# Patient Record
Sex: Female | Born: 1985 | Race: Black or African American | Hispanic: No | Marital: Single | Smoking: Never smoker
Health system: Southern US, Community
[De-identification: ages and names within clinical notes are randomized; demographics above are authoritative.]

## PROBLEM LIST (undated history)

## (undated) DIAGNOSIS — F329 Major depressive disorder, single episode, unspecified: Secondary | ICD-10-CM

## (undated) DIAGNOSIS — Z789 Other specified health status: Secondary | ICD-10-CM

## (undated) DIAGNOSIS — F32A Depression, unspecified: Secondary | ICD-10-CM

## (undated) HISTORY — PX: NO PAST SURGERIES: SHX2092

---

## 2009-01-25 ENCOUNTER — Encounter: Admission: RE | Admit: 2009-01-25 | Discharge: 2009-01-25 | Payer: Self-pay | Admitting: Pulmonary Disease

## 2009-05-01 ENCOUNTER — Emergency Department (HOSPITAL_COMMUNITY): Admission: EM | Admit: 2009-05-01 | Discharge: 2009-05-02 | Payer: Self-pay | Admitting: Emergency Medicine

## 2009-12-04 ENCOUNTER — Inpatient Hospital Stay (HOSPITAL_COMMUNITY): Admission: AD | Admit: 2009-12-04 | Discharge: 2009-12-05 | Payer: Self-pay | Admitting: Obstetrics

## 2009-12-06 ENCOUNTER — Inpatient Hospital Stay (HOSPITAL_COMMUNITY): Admission: AD | Admit: 2009-12-06 | Discharge: 2009-12-08 | Payer: Self-pay | Admitting: Obstetrics

## 2010-12-18 NOTE — L&D Delivery Note (Signed)
Delivery Note At 6:39 AM a viable female was delivered via Vaginal, Spontaneous Delivery (Presentation: ;  ).  APGAR: , ; weight .   Placenta status: , .  Cord:  with the following complications: .  Cord pH: not done  Anesthesia: Epidural  Episiotomy:  Lacerations:  Suture Repair: 2.0 Est. Blood Loss (mL):   Mom to postpartum.  Baby to nursery-stable.  MARSHALL,BERNARD A 10/08/2011, 6:51 AM

## 2010-12-24 IMAGING — CT CT HEAD W/O CM
1 of 2 series · 16 of 30 positions shown, 20 images · non-contrast
Comparison: None

CLINICAL DATA: Altered level of consciousness.  The patient is
unresponsive.

CT HEAD WITHOUT CONTRAST
TECHNIQUE: Contiguous axial images were obtained from the base of
the skull through the vertex without contrast.

[Series 3: recon 2: brain · axial · 0.49mm/px · z∈[-148,-23]mm · 16 of 56 slices shown, 20 images]
[im 3/56  brain]
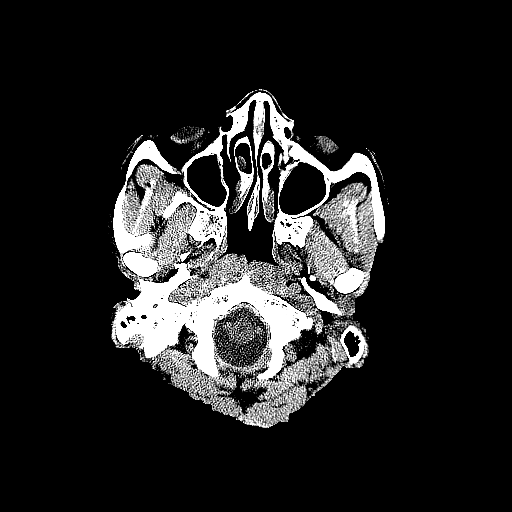
[im 3/56  bone]
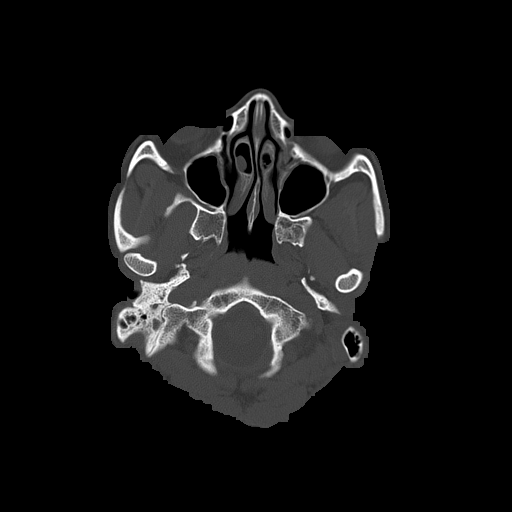
[im 6/56  brain]
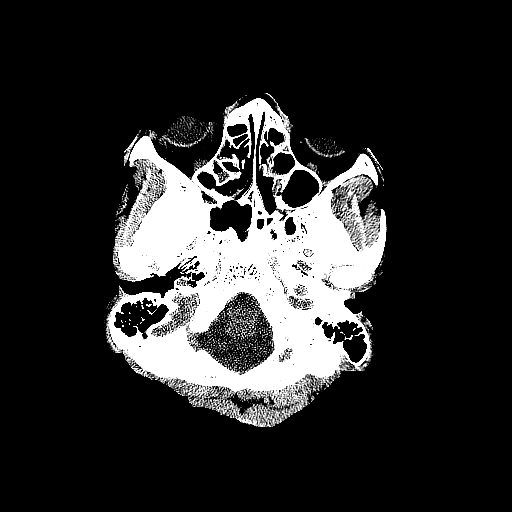
[im 9/56  brain]
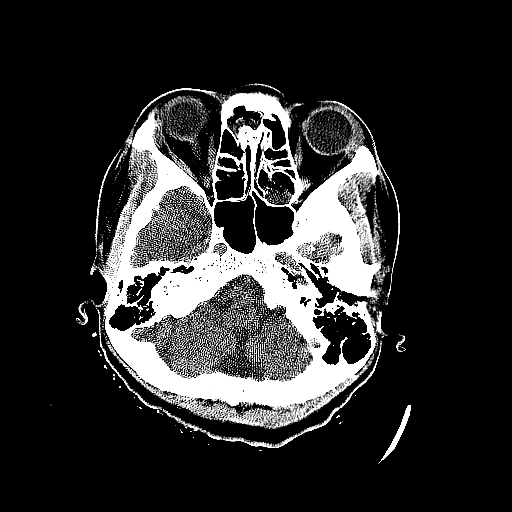
[im 12/56  brain]
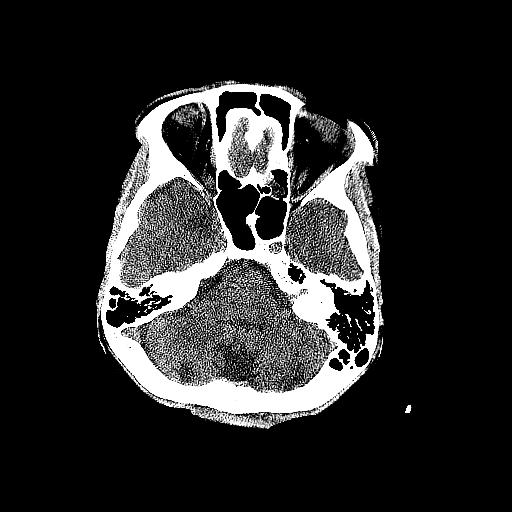
[im 18/56  brain]
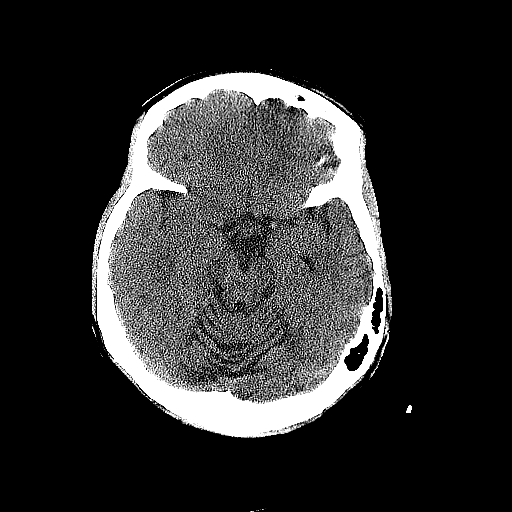
[im 18/56  bone]
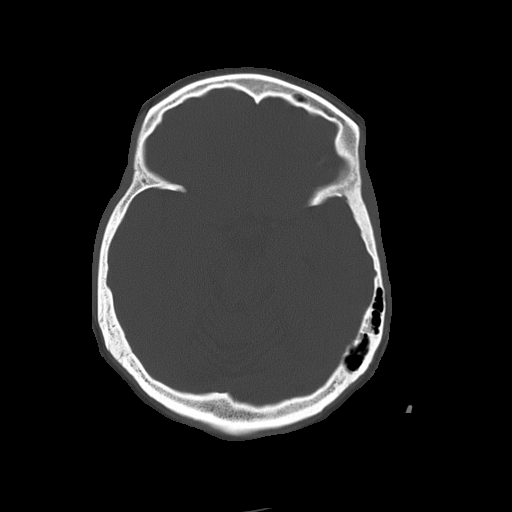
[im 21/56  brain]
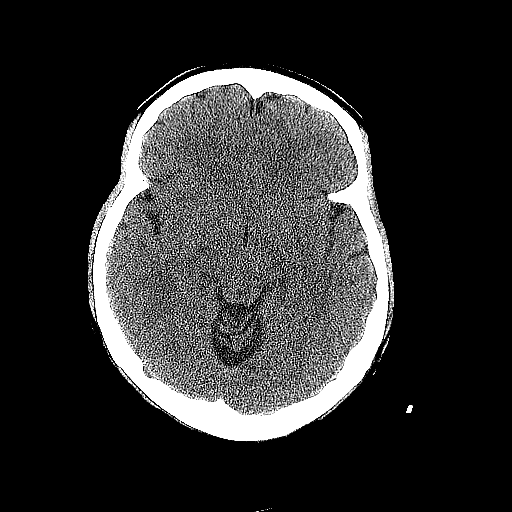
[im 24/56  brain]
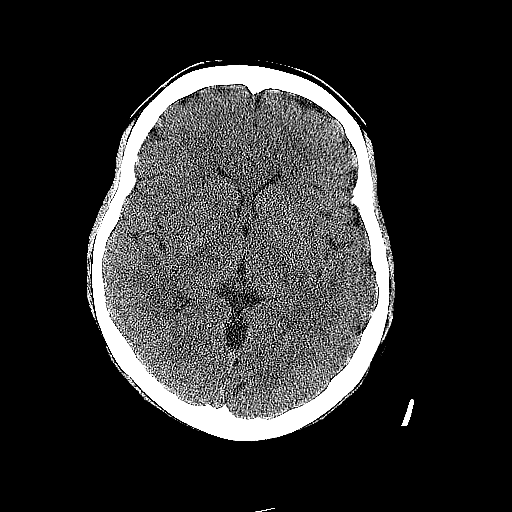
[im 27/56  brain]
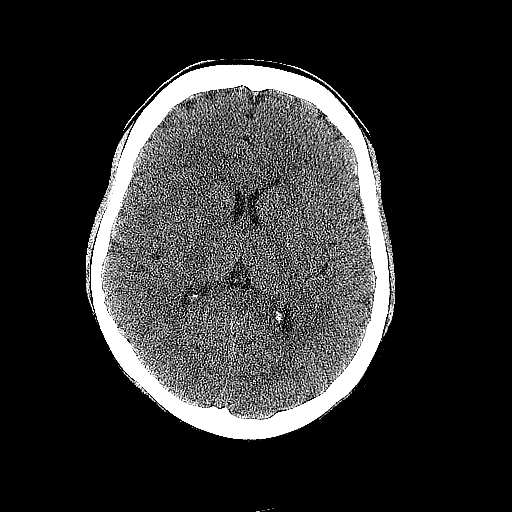
[im 29/56  brain]
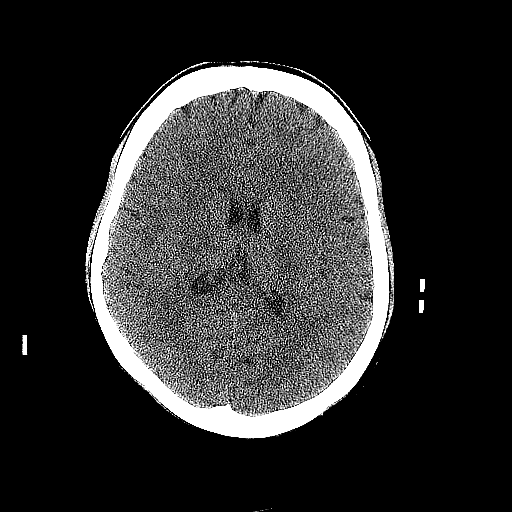
[im 29/56  bone]
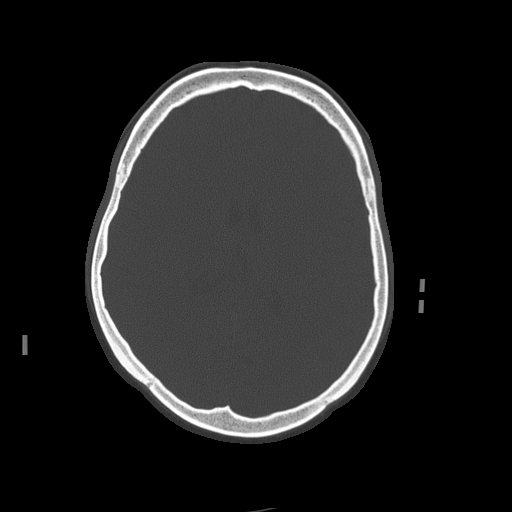
[im 32/56  brain]
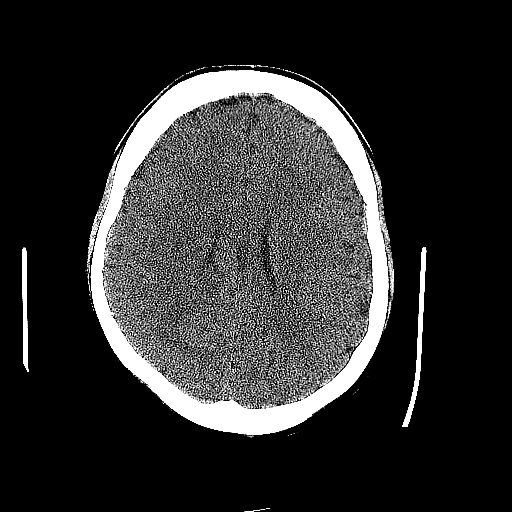
[im 35/56  brain]
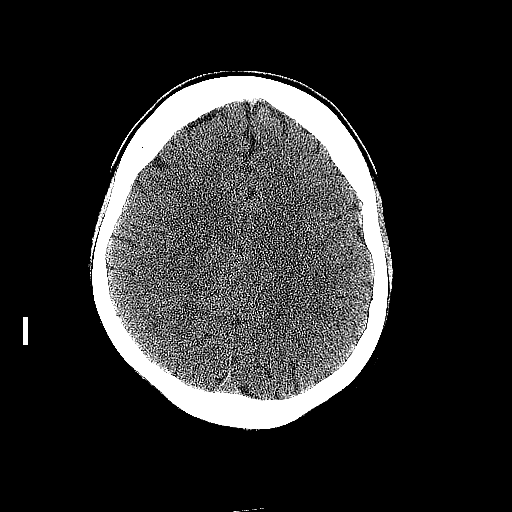
[im 38/56  brain]
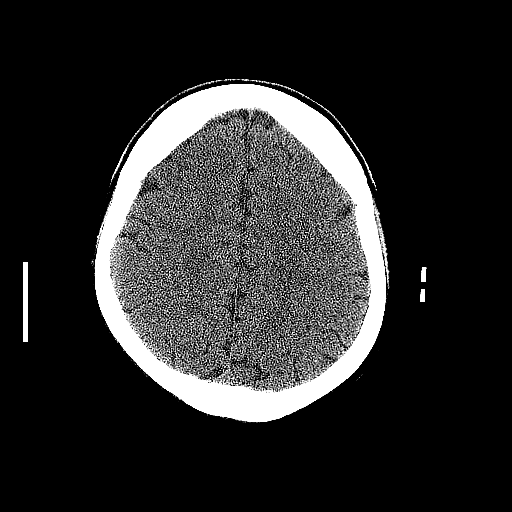
[im 44/56  brain]
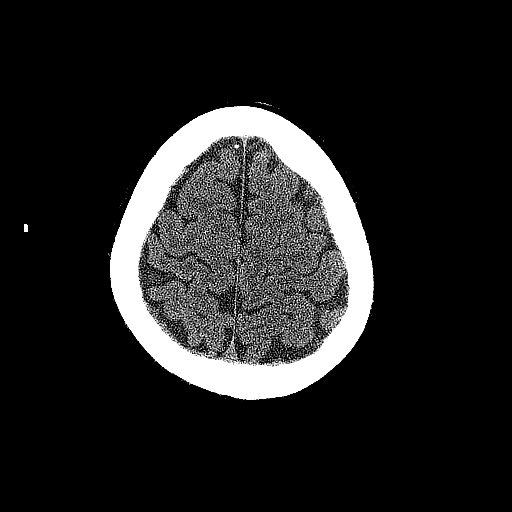
[im 44/56  bone]
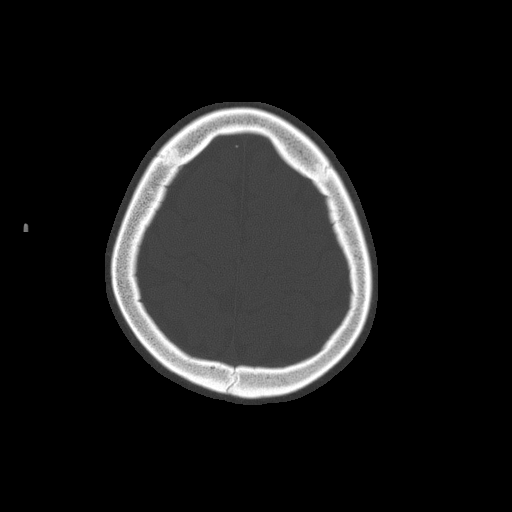
[im 47/56  brain]
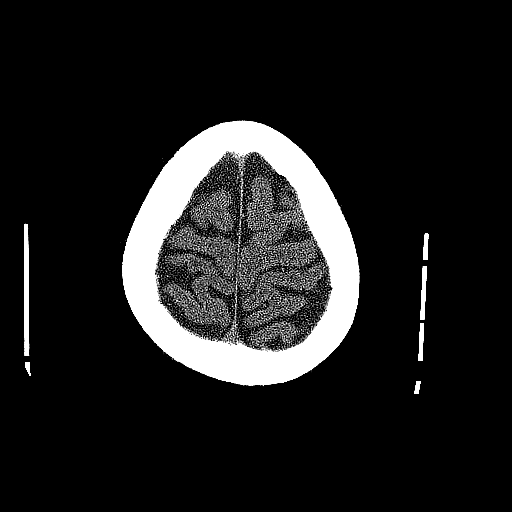
[im 50/56  brain]
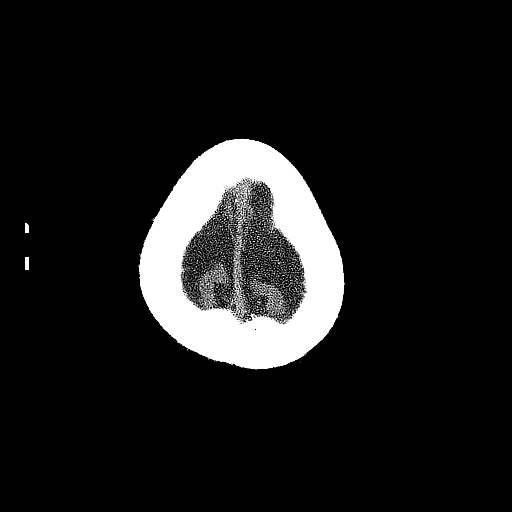
[im 53/56  brain]
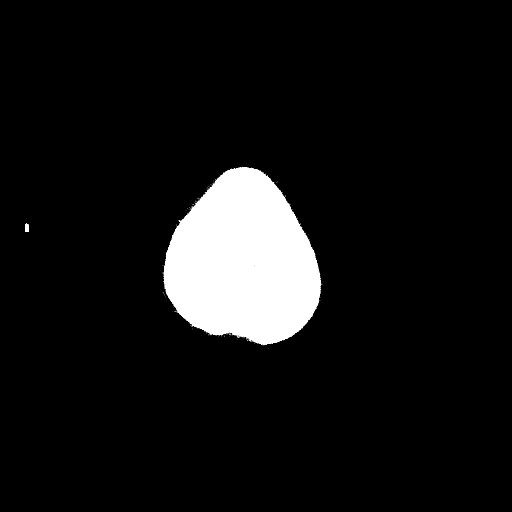

[16 of 30 positions shown; findings below may reference images not displayed]

FINDINGS: There is no acute intracranial hemorrhage, acute
infarction, or intracranial mass lesion.  There is cerebral
hemispheres appear normal.  The patient appears to have mild
atrophy of the cerebellum for a patient of this age.

The bony structures are normal except for some minimal mucosal
thickening in one of the left posterior ethmoid air cells.
IMPRESSION: No acute abnormality of the brain.  Cerebellar atrophy.

## 2010-12-24 IMAGING — CR DG CHEST 1V PORT
1 series · 1 of 1 positions shown · non-contrast
Comparison: 01/25/2009

CLINICAL DATA: Unresponsive.  Altered level of consciousness.

PORTABLE CHEST - 1 VIEW

[AP]
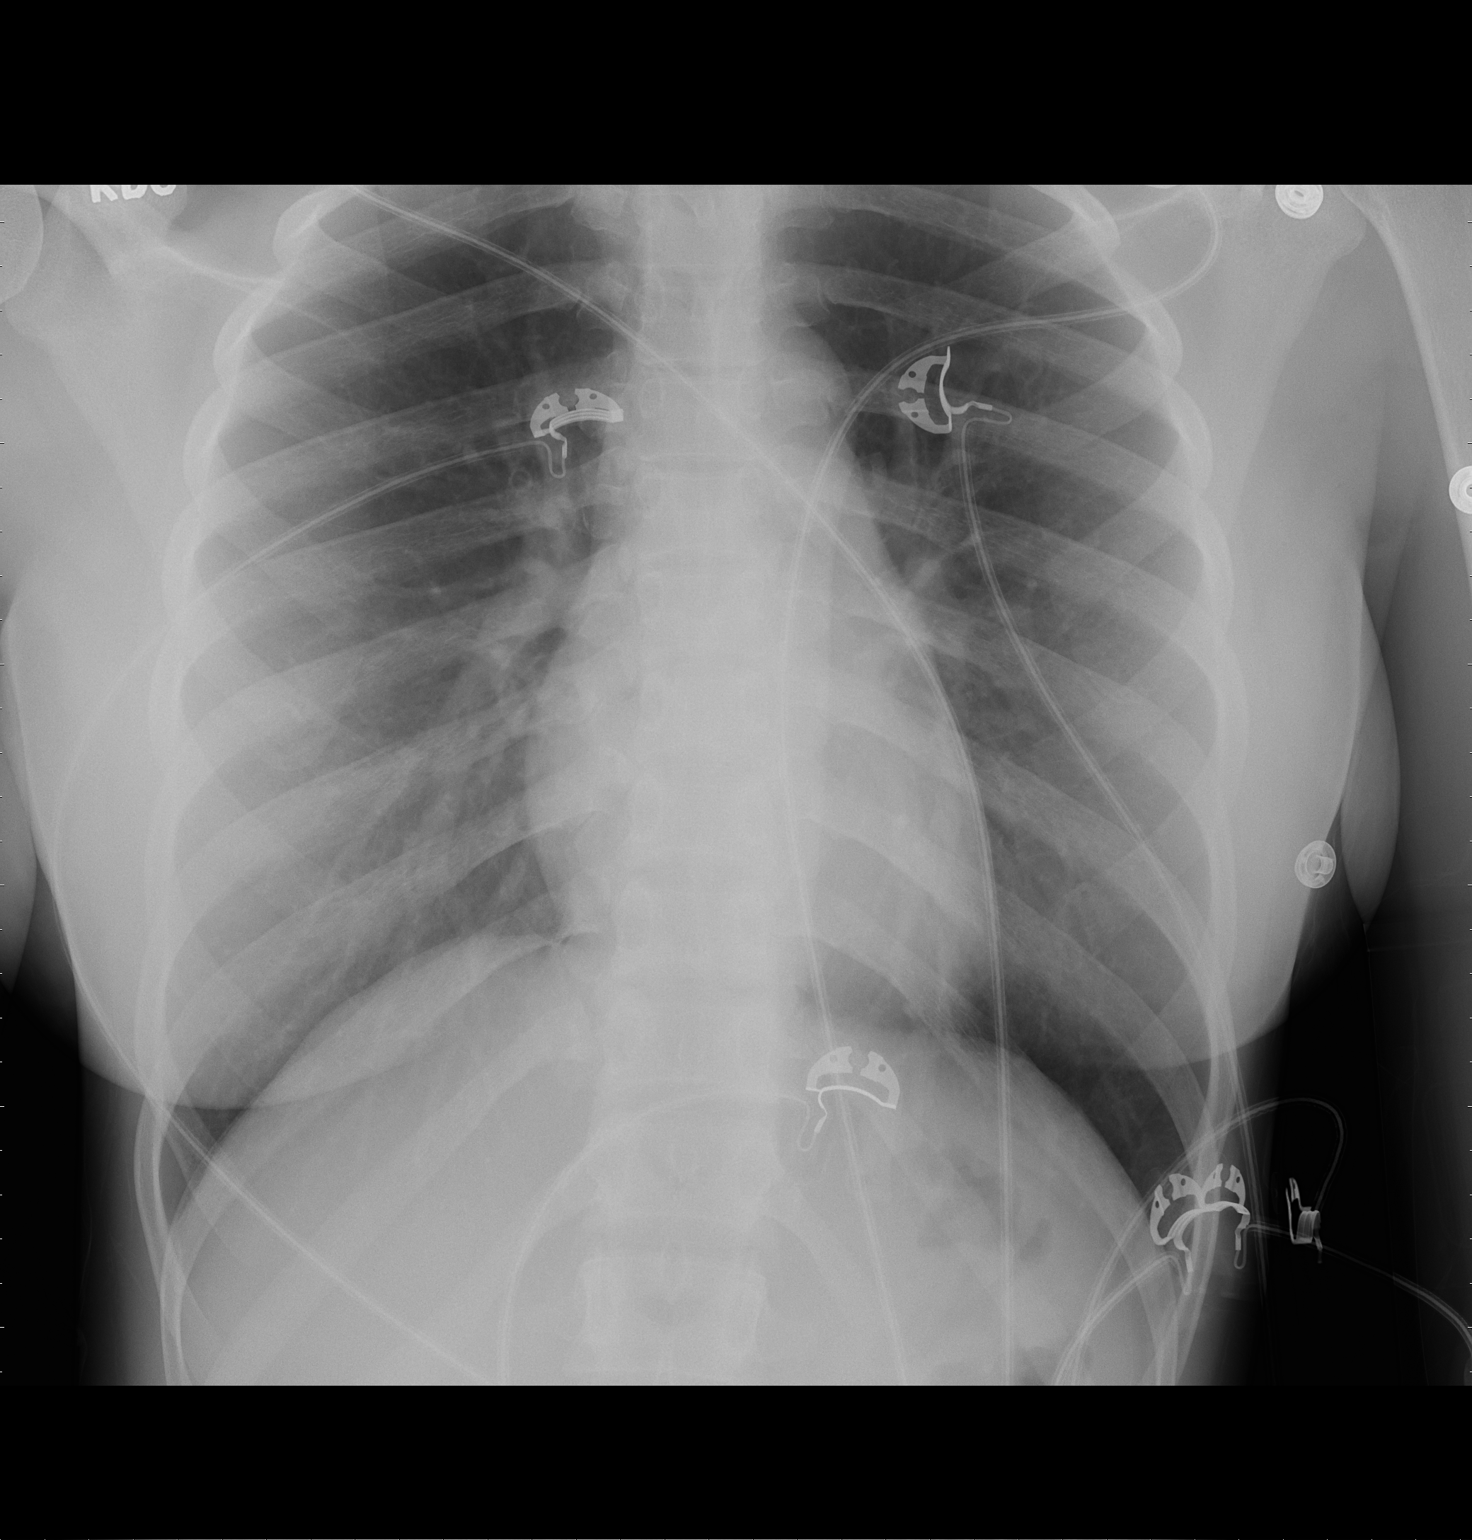

[1 of 1 positions shown; findings below may reference images not displayed]

FINDINGS: Heart size is normal.

There is no pleural effusion or pulmonary edema.

No airspace densities identified.

Review of the visualized osseous structures is unremarkable.
IMPRESSION: 1.  No acute findings.

## 2011-01-25 ENCOUNTER — Ambulatory Visit: Payer: Self-pay

## 2011-01-25 ENCOUNTER — Other Ambulatory Visit: Payer: Self-pay | Admitting: Occupational Medicine

## 2011-01-25 DIAGNOSIS — R7611 Nonspecific reaction to tuberculin skin test without active tuberculosis: Secondary | ICD-10-CM

## 2011-03-01 ENCOUNTER — Encounter (HOSPITAL_COMMUNITY): Payer: Self-pay | Admitting: Radiology

## 2011-03-01 ENCOUNTER — Inpatient Hospital Stay (HOSPITAL_COMMUNITY): Payer: Medicaid Other

## 2011-03-01 ENCOUNTER — Inpatient Hospital Stay (HOSPITAL_COMMUNITY)
Admission: AD | Admit: 2011-03-01 | Discharge: 2011-03-01 | Disposition: A | Discharge status: Home / Self Care | Payer: Medicaid Other | Source: Ambulatory Visit | Attending: Obstetrics | Admitting: Obstetrics

## 2011-03-01 DIAGNOSIS — N390 Urinary tract infection, site not specified: Secondary | ICD-10-CM | POA: Insufficient documentation

## 2011-03-01 DIAGNOSIS — O210 Mild hyperemesis gravidarum: Secondary | ICD-10-CM

## 2011-03-01 DIAGNOSIS — O2390 Unspecified genitourinary tract infection in pregnancy, unspecified trimester: Secondary | ICD-10-CM | POA: Insufficient documentation

## 2011-03-01 LAB — URINALYSIS, ROUTINE W REFLEX MICROSCOPIC
Bilirubin Urine: NEGATIVE
Glucose, UA: NEGATIVE mg/dL
Ketones, ur: NEGATIVE mg/dL
Protein, ur: NEGATIVE mg/dL

## 2011-03-01 LAB — URINE MICROSCOPIC-ADD ON

## 2011-03-02 LAB — URINE CULTURE
Colony Count: 25000
Culture  Setup Time: 201203150114

## 2011-03-20 LAB — CBC
HCT: 38.1 % (ref 36.0–46.0)
MCHC: 33.1 g/dL (ref 30.0–36.0)
MCHC: 33.9 g/dL (ref 30.0–36.0)
MCV: 93.1 fL (ref 78.0–100.0)
Platelets: 162 10*3/uL (ref 150–400)
Platelets: 192 10*3/uL (ref 150–400)
RDW: 13.2 % (ref 11.5–15.5)
RDW: 13.4 % (ref 11.5–15.5)

## 2011-03-20 LAB — RPR: RPR Ser Ql: NONREACTIVE

## 2011-03-28 LAB — GLUCOSE, CAPILLARY

## 2011-03-28 LAB — DIFFERENTIAL
Eosinophils Absolute: 0.7 10*3/uL (ref 0.0–0.7)
Eosinophils Relative: 7 % — ABNORMAL HIGH (ref 0–5)
Lymphocytes Relative: 24 % (ref 12–46)
Lymphs Abs: 2.1 10*3/uL (ref 0.7–4.0)
Monocytes Absolute: 0.8 10*3/uL (ref 0.1–1.0)

## 2011-03-28 LAB — URINALYSIS, ROUTINE W REFLEX MICROSCOPIC
Bilirubin Urine: NEGATIVE
Glucose, UA: NEGATIVE mg/dL
Ketones, ur: 40 mg/dL — AB
Specific Gravity, Urine: 1.03 (ref 1.005–1.030)
pH: 5 (ref 5.0–8.0)

## 2011-03-28 LAB — COMPREHENSIVE METABOLIC PANEL
ALT: 9 U/L (ref 0–35)
AST: 14 U/L (ref 0–37)
Albumin: 3.8 g/dL (ref 3.5–5.2)
CO2: 21 mEq/L (ref 19–32)
Calcium: 9.3 mg/dL (ref 8.4–10.5)
Chloride: 106 mEq/L (ref 96–112)
Creatinine, Ser: 0.62 mg/dL (ref 0.4–1.2)
GFR calc Af Amer: >60 mL/min (ref >60)
GFR calc non Af Amer: >60 mL/min (ref >60)
Sodium: 135 mEq/L (ref 135–145)

## 2011-03-28 LAB — CBC
MCV: 88.7 fL (ref 78.0–100.0)
Platelets: 188 10*3/uL (ref 150–400)
RBC: 4.28 MIL/uL (ref 3.87–5.11)
WBC: 8.9 10*3/uL (ref 4.0–10.5)

## 2011-03-28 LAB — RAPID URINE DRUG SCREEN, HOSP PERFORMED
Amphetamines: NOT DETECTED
Barbiturates: NOT DETECTED
Opiates: NOT DETECTED

## 2011-03-28 LAB — URINE MICROSCOPIC-ADD ON

## 2011-03-28 LAB — HCG, QUANTITATIVE, PREGNANCY: hCG, Beta Chain, Quant, S: 85416 m[IU]/mL — ABNORMAL HIGH (ref <5)

## 2011-03-31 LAB — HIV ANTIBODY (ROUTINE TESTING W REFLEX): HIV: NONREACTIVE

## 2011-03-31 LAB — ABO/RH: RH Type: POSITIVE

## 2011-05-08 ENCOUNTER — Inpatient Hospital Stay (INDEPENDENT_AMBULATORY_CARE_PROVIDER_SITE_OTHER)
Admission: RE | Admit: 2011-05-08 | Discharge: 2011-05-08 | Disposition: A | Discharge status: Home / Self Care | Payer: Medicaid Other | Source: Ambulatory Visit | Attending: Emergency Medicine | Admitting: Emergency Medicine

## 2011-05-08 DIAGNOSIS — B9789 Other viral agents as the cause of diseases classified elsewhere: Secondary | ICD-10-CM

## 2011-05-08 LAB — CBC
HCT: 33.7 % — ABNORMAL LOW (ref 36.0–46.0)
Platelets: 213 10*3/uL (ref 150–400)
RBC: 3.9 MIL/uL (ref 3.87–5.11)
RDW: 12.9 % (ref 11.5–15.5)
WBC: 9.9 10*3/uL (ref 4.0–10.5)

## 2011-05-08 LAB — COMPREHENSIVE METABOLIC PANEL
AST: 12 U/L (ref 0–37)
Albumin: 3.2 g/dL — ABNORMAL LOW (ref 3.5–5.2)
Calcium: 8.9 mg/dL (ref 8.4–10.5)
Creatinine, Ser: 0.49 mg/dL (ref 0.4–1.2)
GFR calc Af Amer: >60 mL/min (ref >60)

## 2011-05-08 LAB — POCT URINALYSIS DIP (DEVICE)
Bilirubin Urine: NEGATIVE
Glucose, UA: NEGATIVE mg/dL
Nitrite: NEGATIVE
pH: 5.5 (ref 5.0–8.0)

## 2011-05-08 LAB — DIFFERENTIAL
Basophils Absolute: 0 10*3/uL (ref 0.0–0.1)
Eosinophils Relative: 5 % (ref 0–5)
Lymphocytes Relative: 26 % (ref 12–46)
Neutrophils Relative %: 62 % (ref 43–77)

## 2011-08-16 ENCOUNTER — Encounter (HOSPITAL_COMMUNITY): Payer: Self-pay | Admitting: *Deleted

## 2011-08-16 ENCOUNTER — Inpatient Hospital Stay (HOSPITAL_COMMUNITY)
Admission: AD | Admit: 2011-08-16 | Discharge: 2011-08-16 | Disposition: A | Discharge status: Home / Self Care | Payer: Medicaid Other | Source: Ambulatory Visit | Attending: Obstetrics | Admitting: Obstetrics

## 2011-08-16 DIAGNOSIS — O4700 False labor before 37 completed weeks of gestation, unspecified trimester: Secondary | ICD-10-CM | POA: Insufficient documentation

## 2011-08-16 LAB — URINALYSIS, ROUTINE W REFLEX MICROSCOPIC
Bilirubin Urine: NEGATIVE
Hgb urine dipstick: NEGATIVE
Nitrite: NEGATIVE
Specific Gravity, Urine: 1.01 (ref 1.005–1.030)
pH: 6 (ref 5.0–8.0)

## 2011-08-16 LAB — URINE MICROSCOPIC-ADD ON

## 2011-08-16 LAB — FETAL FIBRONECTIN: Fetal Fibronectin: NEGATIVE

## 2011-08-16 MED ORDER — DEXTROSE 5 % IN LACTATED RINGERS IV BOLUS: 1000.0000 mL | Freq: Once | INTRAVENOUS | Status: DC

## 2011-08-16 MED ORDER — LACTATED RINGERS IV SOLN
INTRAVENOUS | Status: DC
  Administered 2011-08-16: 17:00:00 via INTRAVENOUS

## 2011-08-16 MED ORDER — TERBUTALINE SULFATE 1 MG/ML IJ SOLN
0.2500 mg | Freq: Once | INTRAMUSCULAR | Status: AC
  Administered 2011-08-16: 0.25 mg via SUBCUTANEOUS
  Filled 2011-08-16: qty 1

## 2011-08-16 NOTE — ED Provider Notes (Signed)
History   pt is [redacted]weeks pregnant, pt of Dr. Elsie Stain who presents with abdominal pain.  Pt said she was having pain on and off before today and saw Dr. Gaynell Face in the office yesterday and he examined her cervix and said it was closed.  Pt began having increase in pain today at 10 am.  Pt says her pain is in her abdomen like contractions.  She denies leakage of fluid or bleeding.   Baby is active  Chief Complaint  Patient presents with  . Contractions   The history is provided by the patient.     No past medical history on file.  No past surgical history on file.  No family history on file.  History  Substance Use Topics  . Smoking status: Never Smoker   . Smokeless tobacco: Not on file  . Alcohol Use: No    Allergies: No Known Allergies  Prescriptions prior to admission  Medication Sig Dispense Refill  . prenatal vitamin w/FE, FA (PRENATAL 1 + 1) 27-1 MG TABS Take 1 tablet by mouth daily.          ROS Physical Exam   Blood pressure 103/60, pulse 81, temperature 98.8 F (37.1 C), temperature source Oral, resp. rate 16, last menstrual period 12/13/2010.  Physical Exam  Vitals reviewed. Constitutional: She is oriented to person, place, and time. She appears well-developed and well-nourished.  HENT:  Head: Normocephalic.  Eyes: Pupils are equal, round, and reactive to light.  Neck: Normal range of motion.  Cardiovascular: Normal rate.   Respiratory: Effort normal.  GI: Soft.  Neurological: She is alert and oriented to person, place, and time.  Skin: Skin is warm and dry.    MAU Course  Procedures Fetal monitor was placed on pt - baseline FHR 135 bpm with reactive strip; contractions every 2 to 3 minutes lasting 50 seconds initially. Cervix closed.  Discussed with Dr. Gaynell Face- pt was hydrated with IVF and given one dose of SQ Terb with resolution of pain and contractions.     Assessment and Plan: Threatened preterm labor- pt to f/u with Dr. Gaynell Face- stay  hydrated-  Kick counts given Report increase in symptoms  LINEBERRY,SUSAN 08/16/2011, 4:39 PM

## 2011-09-25 ENCOUNTER — Encounter (HOSPITAL_COMMUNITY): Payer: Self-pay | Admitting: *Deleted

## 2011-09-25 ENCOUNTER — Inpatient Hospital Stay (HOSPITAL_COMMUNITY)
Admission: AD | Admit: 2011-09-25 | Discharge: 2011-09-25 | Disposition: A | Discharge status: Home / Self Care | Payer: Medicaid Other | Source: Ambulatory Visit | Attending: Obstetrics | Admitting: Obstetrics

## 2011-09-25 DIAGNOSIS — O479 False labor, unspecified: Secondary | ICD-10-CM | POA: Insufficient documentation

## 2011-09-25 HISTORY — DX: Other specified health status: Z78.9

## 2011-09-25 NOTE — Progress Notes (Signed)
Pt G2 P1 at 36.5wks, reports contractions every and passed mucous plug today.  Pt denies problems with pregnancy or vag bleeding.

## 2011-10-08 ENCOUNTER — Encounter (HOSPITAL_COMMUNITY): Payer: Self-pay | Admitting: *Deleted

## 2011-10-08 ENCOUNTER — Inpatient Hospital Stay (HOSPITAL_COMMUNITY)
Admission: AD | Admit: 2011-10-08 | Discharge: 2011-10-10 | DRG: 775 | Disposition: A | Discharge status: Home / Self Care | Payer: Medicaid Other | Source: Ambulatory Visit | Attending: Obstetrics | Admitting: Obstetrics

## 2011-10-08 ENCOUNTER — Encounter (HOSPITAL_COMMUNITY): Payer: Self-pay | Admitting: Anesthesiology

## 2011-10-08 ENCOUNTER — Inpatient Hospital Stay (HOSPITAL_COMMUNITY): Payer: Medicaid Other | Admitting: Anesthesiology

## 2011-10-08 HISTORY — DX: Major depressive disorder, single episode, unspecified: F32.9

## 2011-10-08 HISTORY — DX: Depression, unspecified: F32.A

## 2011-10-08 LAB — CBC
MCH: 27.4 pg (ref 26.0–34.0)
MCHC: 32.6 g/dL (ref 30.0–36.0)
Platelets: 190 10*3/uL (ref 150–400)
RBC: 4.01 MIL/uL (ref 3.87–5.11)

## 2011-10-08 LAB — RPR: RPR Ser Ql: NONREACTIVE

## 2011-10-08 MED ORDER — LIDOCAINE HCL (PF) 1 % IJ SOLN
30.0000 mL | INTRAMUSCULAR | Status: DC | PRN
  Filled 2011-10-08 (×2): qty 30

## 2011-10-08 MED ORDER — ONDANSETRON HCL 4 MG PO TABS: 4.0000 mg | ORAL_TABLET | ORAL | Status: DC | PRN

## 2011-10-08 MED ORDER — OXYCODONE-ACETAMINOPHEN 5-325 MG PO TABS
1.0000 | ORAL_TABLET | ORAL | Status: DC | PRN
  Administered 2011-10-08 – 2011-10-10 (×7): 1 via ORAL
  Filled 2011-10-08 (×7): qty 1

## 2011-10-08 MED ORDER — LACTATED RINGERS IV SOLN
500.0000 mL | Freq: Once | INTRAVENOUS | Status: AC
  Administered 2011-10-08: 500 mL via INTRAVENOUS

## 2011-10-08 MED ORDER — BENZOCAINE-MENTHOL 20-0.5 % EX AERO
1.0000 "application " | INHALATION_SPRAY | CUTANEOUS | Status: DC | PRN
  Administered 2011-10-08: 1 via TOPICAL

## 2011-10-08 MED ORDER — BENZOCAINE-MENTHOL 20-0.5 % EX AERO
INHALATION_SPRAY | CUTANEOUS | Status: AC
  Administered 2011-10-08: 21:00:00
  Filled 2011-10-08: qty 56

## 2011-10-08 MED ORDER — SENNOSIDES-DOCUSATE SODIUM 8.6-50 MG PO TABS: 2.0000 | ORAL_TABLET | Freq: Every day | ORAL | Status: DC

## 2011-10-08 MED ORDER — LACTATED RINGERS IV SOLN
INTRAVENOUS | Status: DC
  Administered 2011-10-08: 05:00:00 via INTRAVENOUS

## 2011-10-08 MED ORDER — FLEET ENEMA 7-19 GM/118ML RE ENEM: 1.0000 | ENEMA | RECTAL | Status: DC | PRN

## 2011-10-08 MED ORDER — DIBUCAINE 1 % RE OINT: 1.0000 "application " | TOPICAL_OINTMENT | RECTAL | Status: DC | PRN

## 2011-10-08 MED ORDER — SENNOSIDES-DOCUSATE SODIUM 8.6-50 MG PO TABS
2.0000 | ORAL_TABLET | Freq: Every day | ORAL | Status: DC
  Administered 2011-10-08 – 2011-10-09 (×2): 2 via ORAL

## 2011-10-08 MED ORDER — ONDANSETRON HCL 4 MG/2ML IJ SOLN: 4.0000 mg | INTRAMUSCULAR | Status: DC | PRN

## 2011-10-08 MED ORDER — OXYTOCIN 20 UNITS IN LACTATED RINGERS INFUSION - SIMPLE
125.0000 mL/h | Freq: Once | INTRAVENOUS | Status: DC
  Administered 2011-10-08: 999 mL/h via INTRAVENOUS

## 2011-10-08 MED ORDER — WITCH HAZEL-GLYCERIN EX PADS: 1.0000 "application " | MEDICATED_PAD | CUTANEOUS | Status: DC | PRN

## 2011-10-08 MED ORDER — IBUPROFEN 600 MG PO TABS: 600.0000 mg | ORAL_TABLET | Freq: Four times a day (QID) | ORAL | Status: DC | PRN

## 2011-10-08 MED ORDER — LANOLIN HYDROUS EX OINT: TOPICAL_OINTMENT | CUTANEOUS | Status: DC | PRN

## 2011-10-08 MED ORDER — SIMETHICONE 80 MG PO CHEW: 80.0000 mg | CHEWABLE_TABLET | ORAL | Status: DC | PRN

## 2011-10-08 MED ORDER — EPHEDRINE 5 MG/ML INJ
10.0000 mg | INTRAVENOUS | Status: DC | PRN
  Filled 2011-10-08: qty 4

## 2011-10-08 MED ORDER — PHENYLEPHRINE 40 MCG/ML (10ML) SYRINGE FOR IV PUSH (FOR BLOOD PRESSURE SUPPORT)
80.0000 ug | PREFILLED_SYRINGE | INTRAVENOUS | Status: DC | PRN
  Filled 2011-10-08 (×2): qty 5

## 2011-10-08 MED ORDER — TETANUS-DIPHTH-ACELL PERTUSSIS 5-2.5-18.5 LF-MCG/0.5 IM SUSP
0.5000 mL | Freq: Once | INTRAMUSCULAR | Status: AC
  Administered 2011-10-09: 0.5 mL via INTRAMUSCULAR
  Filled 2011-10-08: qty 0.5

## 2011-10-08 MED ORDER — OXYCODONE-ACETAMINOPHEN 5-325 MG PO TABS: 1.0000 | ORAL_TABLET | ORAL | Status: DC | PRN

## 2011-10-08 MED ORDER — FENTANYL 2.5 MCG/ML BUPIVACAINE 1/10 % EPIDURAL INFUSION (WH - ANES)
INTRAMUSCULAR | Status: DC | PRN
  Administered 2011-10-08: 14 mL/h via EPIDURAL

## 2011-10-08 MED ORDER — EPHEDRINE 5 MG/ML INJ
10.0000 mg | INTRAVENOUS | Status: DC | PRN
  Filled 2011-10-08 (×2): qty 4

## 2011-10-08 MED ORDER — DIPHENHYDRAMINE HCL 25 MG PO CAPS: 25.0000 mg | ORAL_CAPSULE | Freq: Four times a day (QID) | ORAL | Status: DC | PRN

## 2011-10-08 MED ORDER — TETANUS-DIPHTH-ACELL PERTUSSIS 5-2.5-18.5 LF-MCG/0.5 IM SUSP: 0.5000 mL | Freq: Once | INTRAMUSCULAR | Status: DC

## 2011-10-08 MED ORDER — FENTANYL 2.5 MCG/ML BUPIVACAINE 1/10 % EPIDURAL INFUSION (WH - ANES)
14.0000 mL/h | INTRAMUSCULAR | Status: DC
  Filled 2011-10-08: qty 60

## 2011-10-08 MED ORDER — LACTATED RINGERS IV SOLN: 500.0000 mL | INTRAVENOUS | Status: DC | PRN

## 2011-10-08 MED ORDER — IBUPROFEN 600 MG PO TABS
600.0000 mg | ORAL_TABLET | Freq: Four times a day (QID) | ORAL | Status: DC
Start: 2011-10-08 — End: 2011-10-10
  Administered 2011-10-08 – 2011-10-10 (×9): 600 mg via ORAL
  Filled 2011-10-08 (×10): qty 1

## 2011-10-08 MED ORDER — PRENATAL PLUS 27-1 MG PO TABS: 1.0000 | ORAL_TABLET | Freq: Every day | ORAL | Status: DC

## 2011-10-08 MED ORDER — ACETAMINOPHEN 325 MG PO TABS: 650.0000 mg | ORAL_TABLET | ORAL | Status: DC | PRN

## 2011-10-08 MED ORDER — IBUPROFEN 600 MG PO TABS: 600.0000 mg | ORAL_TABLET | Freq: Four times a day (QID) | ORAL | Status: DC

## 2011-10-08 MED ORDER — OXYTOCIN BOLUS FROM INFUSION
500.0000 mL | Freq: Once | INTRAVENOUS | Status: DC
  Filled 2011-10-08: qty 1000
  Filled 2011-10-08: qty 500

## 2011-10-08 MED ORDER — ZOLPIDEM TARTRATE 5 MG PO TABS: 5.0000 mg | ORAL_TABLET | Freq: Every evening | ORAL | Status: DC | PRN

## 2011-10-08 MED ORDER — PRENATAL PLUS 27-1 MG PO TABS
1.0000 | ORAL_TABLET | Freq: Every day | ORAL | Status: DC
  Administered 2011-10-08 – 2011-10-10 (×3): 1 via ORAL
  Filled 2011-10-08 (×3): qty 1

## 2011-10-08 MED ORDER — OXYCODONE-ACETAMINOPHEN 5-325 MG PO TABS: 2.0000 | ORAL_TABLET | ORAL | Status: DC | PRN

## 2011-10-08 MED ORDER — FERROUS SULFATE 325 (65 FE) MG PO TABS: 325.0000 mg | ORAL_TABLET | Freq: Two times a day (BID) | ORAL | Status: DC

## 2011-10-08 MED ORDER — FERROUS SULFATE 325 (65 FE) MG PO TABS
325.0000 mg | ORAL_TABLET | Freq: Two times a day (BID) | ORAL | Status: DC
  Administered 2011-10-08 – 2011-10-10 (×5): 325 mg via ORAL
  Filled 2011-10-08 (×5): qty 1

## 2011-10-08 MED ORDER — CITRIC ACID-SODIUM CITRATE 334-500 MG/5ML PO SOLN: 30.0000 mL | ORAL | Status: DC | PRN

## 2011-10-08 MED ORDER — PHENYLEPHRINE 40 MCG/ML (10ML) SYRINGE FOR IV PUSH (FOR BLOOD PRESSURE SUPPORT)
80.0000 ug | PREFILLED_SYRINGE | INTRAVENOUS | Status: DC | PRN
  Filled 2011-10-08: qty 5

## 2011-10-08 MED ORDER — BENZOCAINE-MENTHOL 20-0.5 % EX AERO: 1.0000 "application " | INHALATION_SPRAY | CUTANEOUS | Status: DC | PRN

## 2011-10-08 MED ORDER — SODIUM BICARBONATE 8.4 % IV SOLN
INTRAVENOUS | Status: DC | PRN
  Administered 2011-10-08: 5 mL via EPIDURAL

## 2011-10-08 MED ORDER — DIPHENHYDRAMINE HCL 50 MG/ML IJ SOLN: 12.5000 mg | INTRAMUSCULAR | Status: DC | PRN

## 2011-10-08 MED ORDER — ONDANSETRON HCL 4 MG/2ML IJ SOLN: 4.0000 mg | Freq: Four times a day (QID) | INTRAMUSCULAR | Status: DC | PRN

## 2011-10-08 NOTE — H&P (Signed)
This is Dr. Francoise Ceo dictating the history and physical on Mia Nash She's a 25 year old gravida 2 para 101 at 93 weeks and 4 days her EDC is 10/18/2011 Her GBS is negative and she has no known allergies She was admitted in labor cervix no 7 cm 100% vertex 0 station with the membranes intact Past medical history negative Past surgical history negative Social history negative System review negative Physical exam so well-developed female in no distress HEENT negative Rest negative Heart regular rhythm no murmurs or gallops Lungs clear Abdomen term Pelvic as described above Legs negative

## 2011-10-08 NOTE — ED Notes (Signed)
Report called to [employer] in [facility]. Pt to BS via stretcher.

## 2011-10-08 NOTE — Anesthesia Postprocedure Evaluation (Signed)
  Anesthesia Post-op Note  Patient: Mia Nash  Procedure(s) Performed: * No procedures listed *  Patient Location: Mother/Baby  Anesthesia Type: Epidural  Level of Consciousness: awake, alert  and oriented  Airway and Oxygen Therapy: Patient Spontanous Breathing  Post-op Pain: mild  Post-op Assessment: Patient's Cardiovascular Status Stable, Respiratory Function Stable, Patent Airway and Pain level controlled  Post-op Vital Signs: stable  Complications: No apparent anesthesia complications

## 2011-10-08 NOTE — Anesthesia Preprocedure Evaluation (Signed)
Anesthesia Evaluation  Name, MR# and DOB Patient awake  General Assessment Comment  Reviewed: Allergy & Precautions, H&P , Patient's Chart, lab work & pertinent test results  Airway Mallampati: II TM Distance: >3 FB Neck ROM: full    Dental  (+) Teeth Intact   Pulmonary  clear to auscultation        Cardiovascular regular Normal    Neuro/Psych    GI/Hepatic   Endo/Other    Renal/GU      Musculoskeletal   Abdominal   Peds  Hematology   Anesthesia Other Findings       Reproductive/Obstetrics (+) Pregnancy                           Anesthesia Physical Anesthesia Plan  ASA: II  Anesthesia Plan: Epidural   Post-op Pain Management:    Induction:   Airway Management Planned:   Additional Equipment:   Intra-op Plan:   Post-operative Plan:   Informed Consent: I have reviewed the patients History and Physical, chart, labs and discussed the procedure including the risks, benefits and alternatives for the proposed anesthesia with the patient or authorized representative who has indicated his/her understanding and acceptance.   Dental Advisory Given  Plan Discussed with:   Anesthesia Plan Comments: (Labs checked- platelets confirmed with RN in room. Fetal heart tracing, per RN, reported to be stable enough for sitting procedure. Discussed epidural, and patient consents to the procedure:  included risk of possible headache,backache, failed block, allergic reaction, and nerve injury. This patient was asked if she had any questions or concerns before the procedure started. )        Anesthesia Quick Evaluation  

## 2011-10-08 NOTE — ED Notes (Signed)
0430 Dr Gaynell Face notified of pt's admission and status by [facility] RN. Will admit to [facility]

## 2011-10-08 NOTE — Anesthesia Procedure Notes (Signed)

## 2011-10-09 LAB — CBC
MCH: 26.9 pg (ref 26.0–34.0)
MCHC: 31.9 g/dL (ref 30.0–36.0)
Platelets: 187 10*3/uL (ref 150–400)
RBC: 3.75 MIL/uL — ABNORMAL LOW (ref 3.87–5.11)

## 2011-10-09 NOTE — Progress Notes (Signed)
UR chart review completed.  

## 2011-10-09 NOTE — Progress Notes (Signed)
Postpartum day one Vital signs normal Fundus firm Lochia moderate Legs negative No complaints    Postpartum day one Vital signs normal Fundus firm Lochia moderate Legs negative No complaints

## 2011-10-10 MED ORDER — INFLUENZA VIRUS VACC SPLIT PF IM SUSP
0.5000 mL | Freq: Once | INTRAMUSCULAR | Status: AC
  Administered 2011-10-10: 0.5 mL via INTRAMUSCULAR
  Filled 2011-10-10: qty 0.5

## 2011-10-10 NOTE — Discharge Summary (Signed)
Obstetric Discharge Summary Reason for Admission: onset of labor Prenatal Procedures: none Intrapartum Procedures: spontaneous vaginal delivery Postpartum Procedures: none Complications-Operative and Postpartum: none Hemoglobin  Date Value Range Status  10/09/2011 10.1* 12.0-15.0 (g/dL) Final     HCT  Date Value Range Status  10/09/2011 31.7* 36.0-46.0 (%) Final    Discharge Diagnoses: Term Pregnancy-delivered  Discharge Information: Date: 10/10/2011 Activity: pelvic rest Diet: routine Medications: Tylenol #3 Condition: stable Instructions: refer to practice specific booklet Discharge to: home Follow-up Information    Follow up with MARSHALL,BERNARD A, MD. Call in 6 weeks.   Contact information:   [address] [place] [address] [phone]          Newborn Data: Live born female  Birth Weight: 6 lb 7.2 oz (2925 g) APGAR: 9, 9  Home with mother.  MARSHALL,BERNARD A 10/10/2011, 7:23 AM

## 2012-10-23 IMAGING — US US OB COMP LESS 14 WK
1 series · 14 of 25 positions shown · non-contrast
Comparison: None.

CLINICAL DATA: Nausea.  Pregnancy.  Vomiting.

OBSTETRIC <14 WK ULTRASOUND
TECHNIQUE: Transabdominal ultrasound was performed for evaluation
of the gestation as well as the maternal uterus and adnexal
regions.

[Series 1: us ob comp less 14 wks · 14 of 25 slices shown]
[im 1/25]
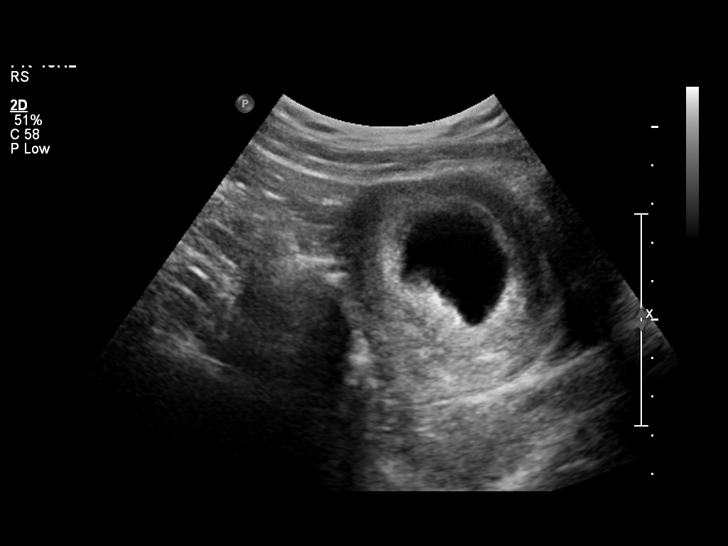
[im 3/25]
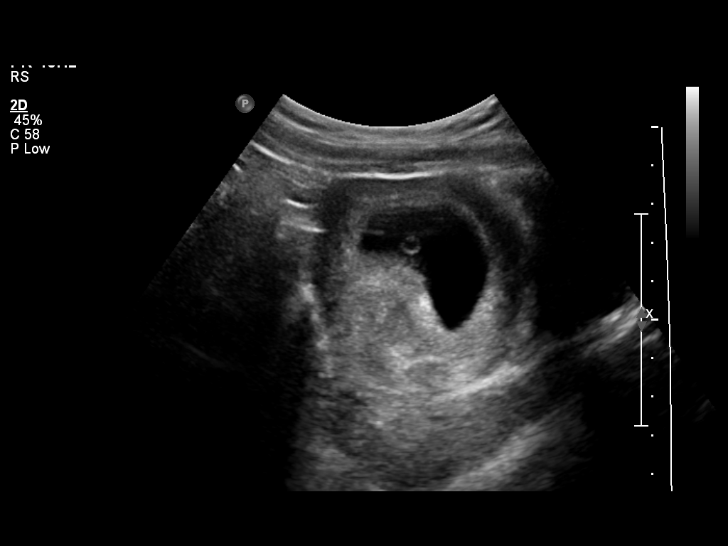
[im 5/25]
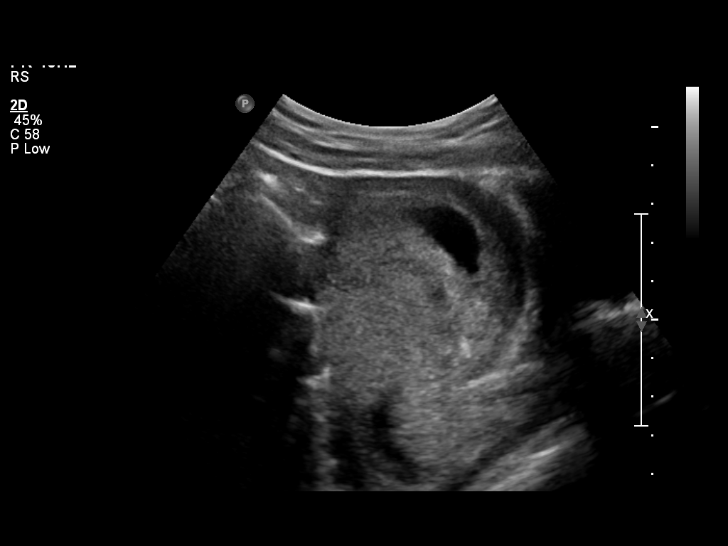
[im 7/25]
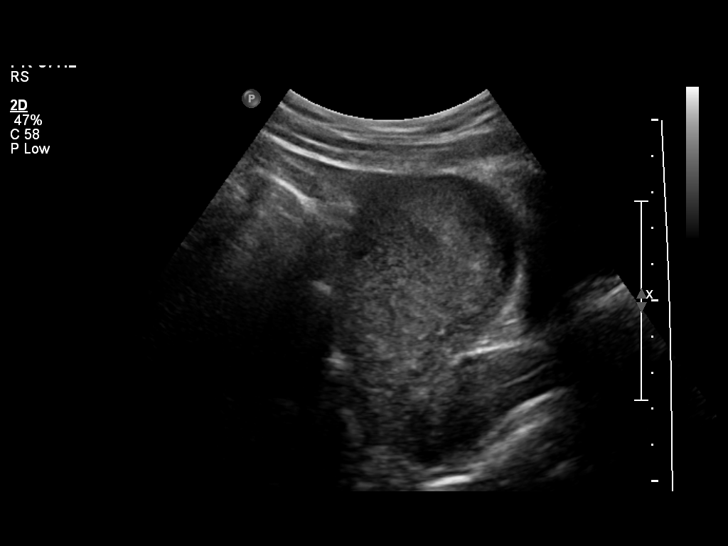
[im 9/25]
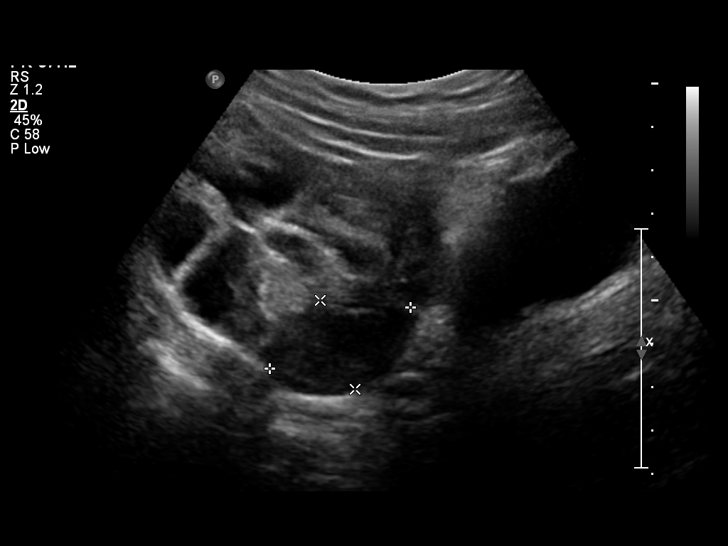
[im 10/25]
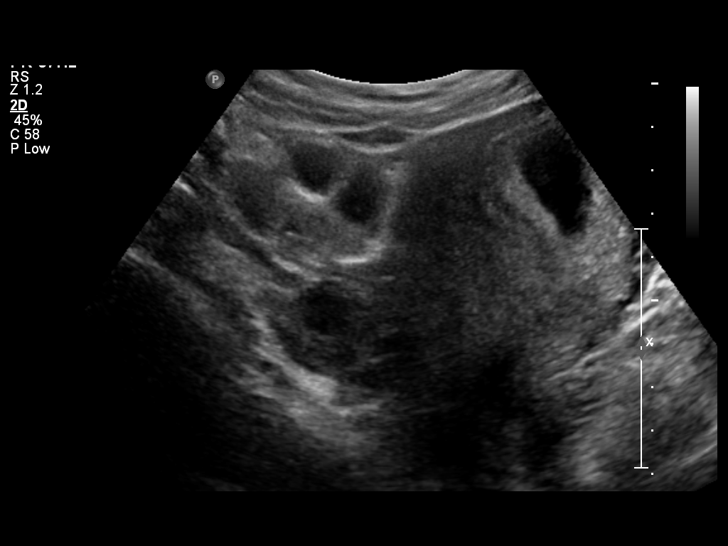
[im 12/25]
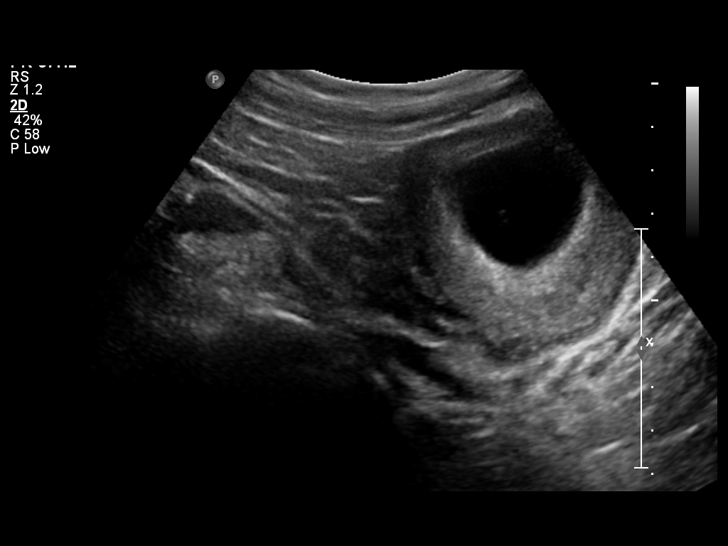
[im 14/25]
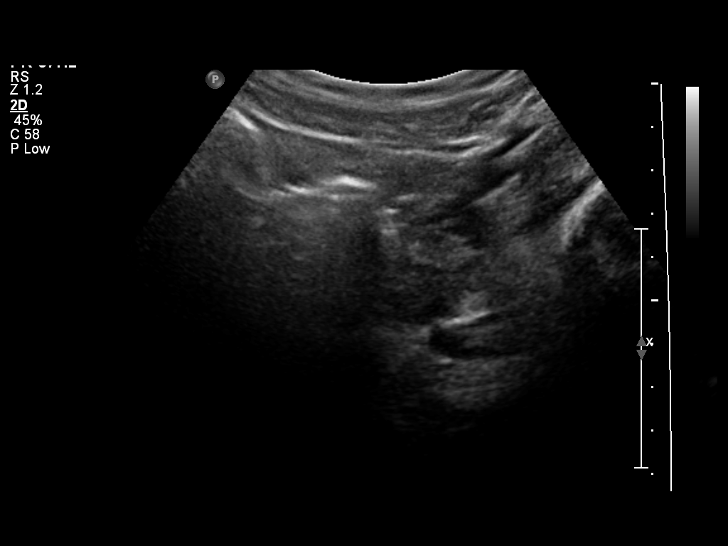
[im 16/25]
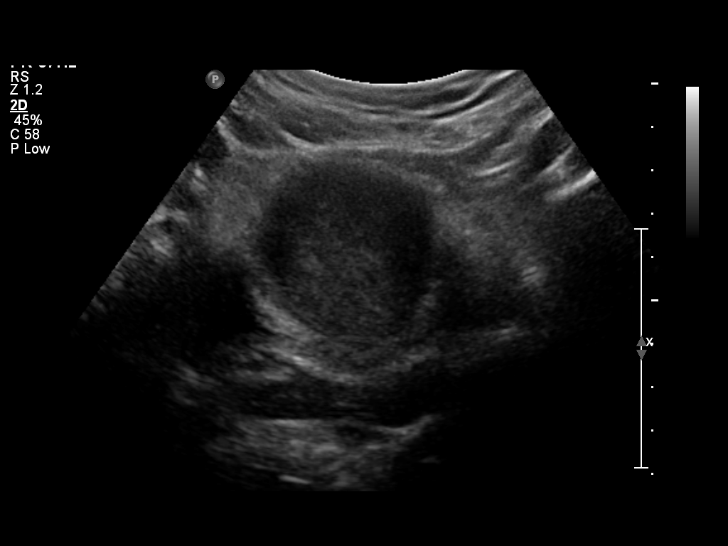
[im 17/25]
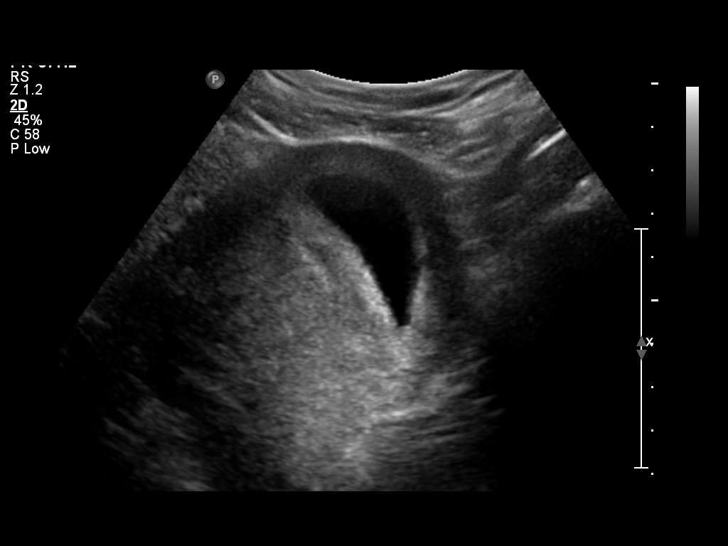
[im 19/25]
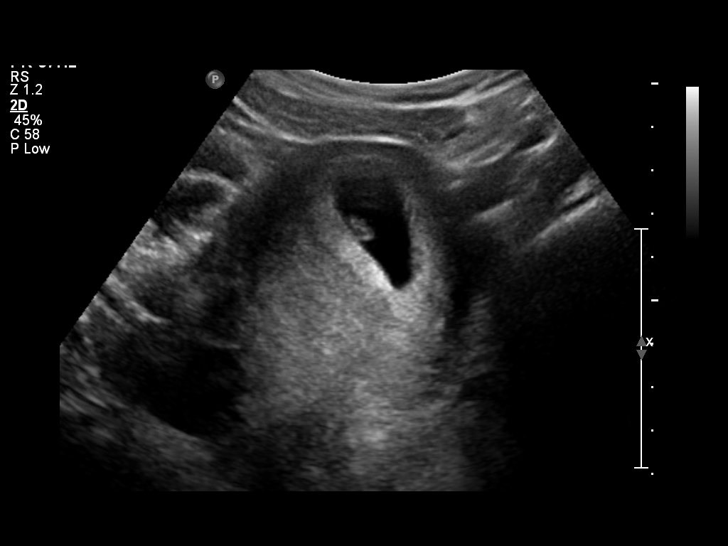
[im 21/25]
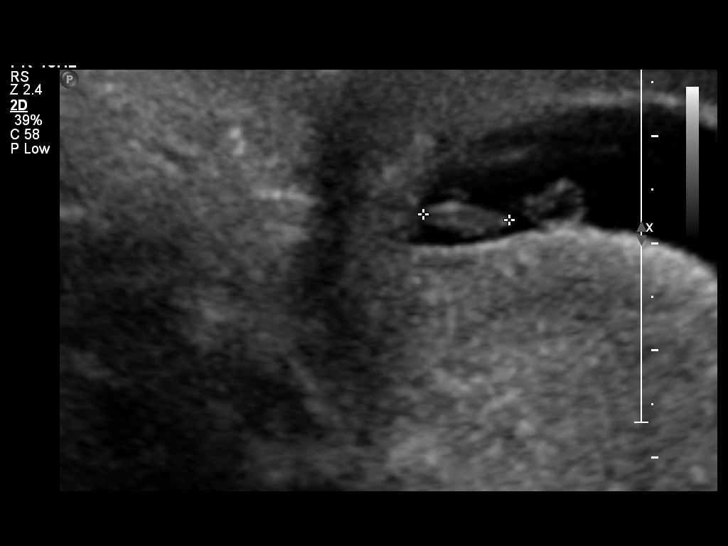
[im 23/25]
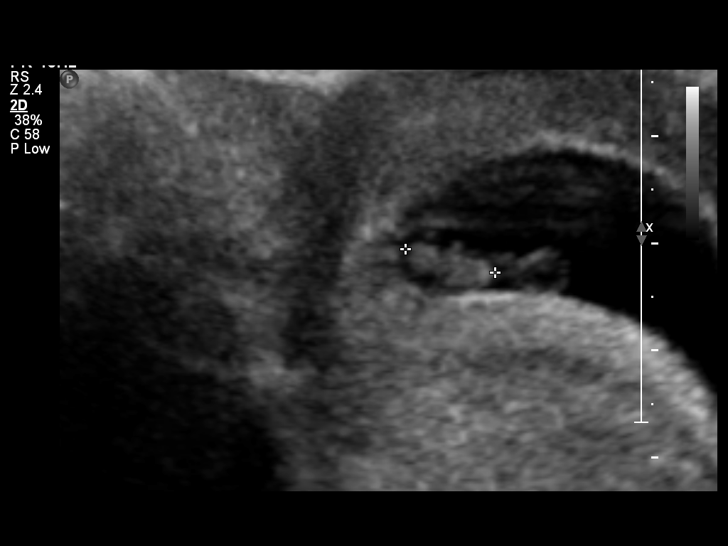
[im 25/25]
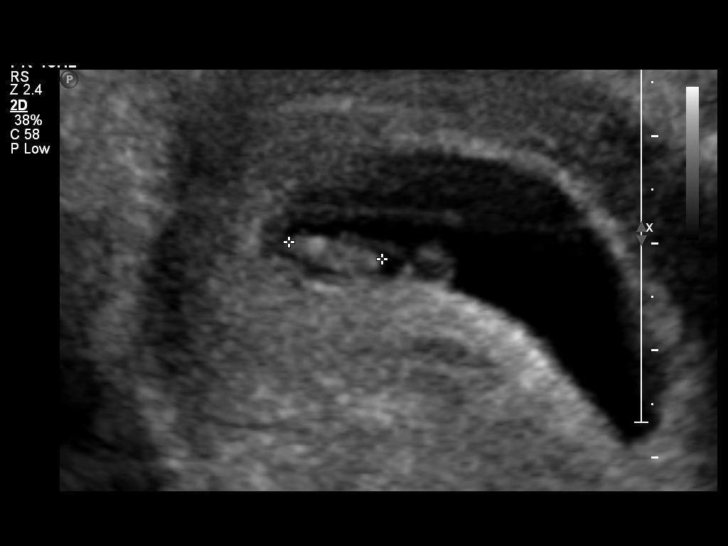

[14 of 25 positions shown; findings below may reference images not displayed]

Intrauterine gestational sac: Single
Yolk sac: Present
Embryo: Present
Cardiac Activity: Present
Heart Rate: 139 bpm

CRL:  8.8 mm  7w  0d          US EDC: 10/18/2011

Maternal uterus/Adnexae:
The uterus is otherwise within normal limits.  There is no
significant subchorionic hemorrhage.  The adnexa are within normal
limits bilaterally.  No significant free fluid is present.
IMPRESSION: Single intrauterine pregnancy with an estimated gestational age of
7 weeks and 0 days.

Although this is discrepant from the estimated gestational age of
the LMP, no complicating features are seen.

## 2013-09-29 ENCOUNTER — Encounter (HOSPITAL_COMMUNITY): Payer: Self-pay | Admitting: Emergency Medicine

## 2013-09-29 ENCOUNTER — Emergency Department (HOSPITAL_COMMUNITY)
Admission: EM | Admit: 2013-09-29 | Discharge: 2013-09-29 | Disposition: A | Discharge status: Home / Self Care | Payer: Self-pay | Source: Home / Self Care | Attending: Family Medicine | Admitting: Family Medicine

## 2013-09-29 MED ORDER — CYCLOBENZAPRINE HCL 5 MG PO TABS: 5.0000 mg | ORAL_TABLET | Freq: Three times a day (TID) | ORAL | Status: DC | PRN
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
## 2014-01-26 ENCOUNTER — Emergency Department (HOSPITAL_COMMUNITY)
Admission: EM | Admit: 2014-01-26 | Discharge: 2014-01-27 | Disposition: A | Discharge status: Home / Self Care | Payer: Self-pay | Attending: Emergency Medicine | Admitting: Emergency Medicine

## 2014-01-26 ENCOUNTER — Emergency Department (HOSPITAL_COMMUNITY): Payer: Self-pay

## 2014-01-26 ENCOUNTER — Encounter (HOSPITAL_COMMUNITY): Payer: Self-pay | Admitting: Emergency Medicine

## 2014-01-26 DIAGNOSIS — Z8659 Personal history of other mental and behavioral disorders: Secondary | ICD-10-CM | POA: Insufficient documentation

## 2014-01-26 DIAGNOSIS — R0789 Other chest pain: Secondary | ICD-10-CM

## 2014-01-26 DIAGNOSIS — Z791 Long term (current) use of non-steroidal anti-inflammatories (NSAID): Secondary | ICD-10-CM | POA: Insufficient documentation

## 2014-01-26 DIAGNOSIS — Z8719 Personal history of other diseases of the digestive system: Secondary | ICD-10-CM | POA: Insufficient documentation

## 2014-01-26 DIAGNOSIS — R071 Chest pain on breathing: Secondary | ICD-10-CM | POA: Insufficient documentation

## 2014-01-26 LAB — CBC
HCT: 35.8 % — ABNORMAL LOW (ref 36.0–46.0)
Hemoglobin: 12.5 g/dL (ref 12.0–15.0)
MCH: 30.1 pg (ref 26.0–34.0)
MCHC: 34.9 g/dL (ref 30.0–36.0)
MCV: 86.3 fL (ref 78.0–100.0)
PLATELETS: 210 10*3/uL (ref 150–400)
RBC: 4.15 MIL/uL (ref 3.87–5.11)
RDW: 12.8 % (ref 11.5–15.5)
WBC: 7.3 10*3/uL (ref 4.0–10.5)

## 2014-01-26 LAB — BASIC METABOLIC PANEL
BUN: 12 mg/dL (ref 6–23)
CALCIUM: 8.6 mg/dL (ref 8.4–10.5)
CO2: 24 meq/L (ref 19–32)
Chloride: 108 mEq/L (ref 96–112)
Creatinine, Ser: 0.81 mg/dL (ref 0.50–1.10)
GFR calc Af Amer: >90 mL/min (ref >90)
GFR calc non Af Amer: >90 mL/min (ref >90)
Glucose, Bld: 87 mg/dL (ref 70–99)
POTASSIUM: 3.9 meq/L (ref 3.7–5.3)
SODIUM: 144 meq/L (ref 137–147)

## 2014-01-26 LAB — POCT I-STAT TROPONIN I: TROPONIN I, POC: 0 ng/mL (ref 0.00–0.08)

## 2014-01-26 NOTE — ED Notes (Signed)
Pt states she took ibuprofen at home with no relief.

## 2014-01-26 NOTE — ED Notes (Signed)
Pt. reports intermittent mid chest pain for 1 month , denies SOB /nausea or diaphoresis .

## 2014-01-27 MED ORDER — KETOROLAC TROMETHAMINE 60 MG/2ML IM SOLN
60.0000 mg | Freq: Once | INTRAMUSCULAR | Status: AC
  Administered 2014-01-27: 60 mg via INTRAMUSCULAR
  Filled 2014-01-27: qty 2

## 2014-01-27 MED ORDER — GI COCKTAIL ~~LOC~~
30.0000 mL | Freq: Once | ORAL | Status: AC
  Administered 2014-01-27: 30 mL via ORAL
  Filled 2014-01-27: qty 30

## 2014-01-27 MED ORDER — NAPROXEN 500 MG PO TABS: 500.0000 mg | ORAL_TABLET | Freq: Two times a day (BID) | ORAL | Status: AC

## 2014-01-27 NOTE — Discharge Instructions (Signed)
Follow up with your Primary Care Doctor in 2 days. Resource guide provided below for follow up reference. Return to the ED should you have worsening chest pain or develop shortness breath.    Emergency Department Resource Guide 1) Find a Doctor and Pay Out of Pocket Although you won't have to find out who is covered by your insurance plan, it is a good idea to ask around and get recommendations. You will then need to call the office and see if the doctor you have chosen will accept you as a new patient and what types of options they offer for patients who are self-pay. Some doctors offer discounts or will set up payment plans for their patients who do not have insurance, but you will need to ask so you aren't surprised when you get to your appointment.  2) Contact Your Local Health Department Not all health departments have doctors that can see patients for sick visits, but many do, so it is worth a call to see if yours does. If you don't know where your local health department is, you can check in your phone book. The CDC also has a tool to help you locate your state's health department, and many state websites also have listings of all of their local health departments.  3) Find a Walk-in Clinic If your illness is not likely to be very severe or complicated, you may want to try a walk in clinic. These are popping up all over the country in pharmacies, drugstores, and shopping centers. They're usually staffed by nurse practitioners or physician assistants that have been trained to treat common illnesses and complaints. They're usually fairly quick and inexpensive. However, if you have serious medical issues or chronic medical problems, these are probably not your best option.  No Primary Care Doctor: - Call Health Connect at  [phone] - they can help you locate a primary care doctor that  accepts your insurance, provides certain services, etc. - Physician Referral Service- [phone]  Chronic  Pain Problems: Organization         Address  Phone   Notes  [facility]  [phone]) [phone] Patients need to be referred by their primary care doctor.   Medication Assistance: Organization         Address  Phone   Notes  [facility] [address] [place], [place] [address] [phone]) [phone] --Must be a resident of [place] -- Must have NO insurance coverage whatsoever (no Medicaid/ Medicare, etc.) -- The pt. MUST have a primary care doctor that directs their care regularly and follows them in the community   [facility]ist  [phone]   [employer]  [phone]) [phone]    Agencies that provide inexpensive medical care: Organization         Address  Phone   Notes  [facility]  [phone]   [facility]    [phone]   [facility] [address] [place], [place] [address] [phone]   [facility] [address], [place] [phone]   [facility]    [phone](336) [phone]   [facility]    [phone]) [phone]   [facility]  [address], [place] Phone:  [phone], Fax:  [phone]) [phone] Hours of Operation:  9 am - 6 pm, M-F.  Also accepts Medicaid/Medicare and self-pay.  [facility]  [address], [place] Phone: [phone], Fax: [phone]. Hours of Operation:  8:30 am - 5:30 pm, M-F.  Also accepts Medicaid and self-pay.  °[facility] [place] [address], [place] Phone: [phone]   °[facility] [address], [place], [place] [phone], Ext. 123 Mondays & Thursdays: 7-9 AM.  First 15 patients are seen on a first come, first serve basis. °  ° °Medicaid-accepting [place] Providers: ° °Organization         Address  Phone   Notes  °[facility] [address], [place] [phone] Also  accepts self-pay patients.  °[facility] [address], [place] ° [phone]   °[facility] [address], [place] [phone]   °[facility] [address] [place] [address], [place] [phone]   °Veita Bland [address], [place]  ° [phone] Only accepts [place] Access Medicaid patients after they have their name applied to their card.  ° °Self-Pay (no insurance) in [place]: ° °Organization         Address  Phone   Notes  °Sickle Cell Patients, [facility] [address], [place] [phone]   °[facility] [address], [place] [phone]   °[facility] ° [address], [place] [phone]   °[facility]/Dr. Osei-Bonsu ° [address] [place] [address], [place] or [address], [place] [phone] Phone number for both [place] and [place] locations is the same.  °[facility] [address], [place] [phone]   °[facility] [address] [place] [address], [place] or [address] [phone] °([phone]) [phone]   °[facility] [address], [place] [phone], phone; [phone], fax Sees patients 1st and 3rd Saturday of every month.  Must not qualify for public or private insurance (i.e. Medicaid, Medicare, [place] Health Choice, Veterans' Benefits) • Household income should be no more than 200% of the poverty level •The clinic cannot treat you if you are pregnant or think you are pregnant • Sexually transmitted diseases are not treated at the clinic.  ° ° °Dental Care: °Organization         Address  Phone  Notes  °[place] [facility] [address], [place] [phone]) [phone] Accepts children up to age 21 who are enrolled in Medicaid or [place] Health Choice; pregnant  women with a Medicaid card; and children who have applied for Medicaid or [place] Health Choice, but were declined, whose parents can pay a reduced fee at time of service.  °[place] [facility] [place]  [address], [place] [phone]) [phone] Accepts children up to age 21 who are enrolled in Medicaid or [place] Health Choice; pregnant women with a Medicaid card; and children who have applied for Medicaid or  Health Choice, but were declined, whose parents can pay a reduced fee at time of service.  °[facility] ° [address],  [phone] Patients are seen by appointment only. Walk-ins are not accepted. [facility] will see patients 18 years of age and older. °Monday - Tuesday (8am-5pm) °Most Wednesdays (8:30-5pm) °$30 per visit, cash only  °[facility]  [address], [facility] [phone] Patients are seen by appointment only. Walk-ins are not accepted. [place] will see patients 60 years of age and older. One Wednesday Evening (Monthly: Volunteer Based).  $30 per visit, cash only  [place]  [phone] for adults; Children under age 4, call Graduate Pediatric Dentistry at [phone]. Children aged 49-14, please call [phone] to request a pediatric application.  Dental services are provided in all areas of dental care including fillings, crowns and bridges, complete and partial dentures, implants, gum treatment, root canals, and extractions. Preventive care is also provided. Treatment is provided to both adults and children. Patients are selected via a lottery and there is often a waiting list.   [facility] [address] [place]  [phone] [email or web address]   [facility] [address] [place], [place] [phone], Ext. [phone] Second and Fourth Thursday of each month, opens at 6:30 AM; Clinic ends at 9 AM.  Patients are  seen on a first-come first-served basis, and a limited number are seen during each clinic.   [facility]  [address] Hillard Danker [place], [place] [phone]   Eligibility Requirements You must have lived in [place], [place], or [place] counties for at least the last three months.   You cannot be eligible for state or federal sponsored [employer], including [employer], [place], or [employer].   You generally cannot be eligible for healthcare insurance through your employer.    How to apply: Eligibility screenings are held every Tuesday and Wednesday afternoon from 1:00 pm until 4:00 pm. You do not need an appointment for the interview!  [facility] [address] [place], [place]   [place] Department  [place]  [phone]    Behavioral Health Resources in the Community: Intensive Outpatient Programs Organization         Address  Phone  Notes  [facility] [place]. [address], [place], [place] [phone]   [facility] [address], [place], [place]   [facility]: [facility] [address] [place] [address], [place], [place]   [place] [address],  [place], [place] or [phone]   Substance Abuse Resources Organization         Address  Phone  Notes  [facility]  [phone]   [place]  [phone]   [facility] [place]   Chinita Pester  [phone]   Residential & Outpatient Substance Abuse Program  [phone]   Psychological Services Organization         Address  Phone  Notes  [facility] [place]  [place]  [phone]   [place] [address] [place] [phone] or [phone]    Mobile Crisis  Teams Organization         Address  Phone  Notes  [facility], Mobile Crisis Care Unit  [phone]   [facility]  [address] [place], [place]   [facility] [address] [place] [phone]    Self-Help/Support Groups Organization         Address  Phone             Notes  [facility] [place] - variety of support groups  [place] Call for more information  Narcotics Anonymous (NA), [facility] [address], [employer] [place]  2 meetings at this location   Special educational needs teacher         Address  Phone  Notes  [facility] [place],    [place]  [phone]   [facility]  [address], [place] [address], [place], [place]   [place], [place] [phone] Admissions: 8am-3pm M-F  Incentives Substance [place] [address],    [place], [place] [phone]   [facility] [address] [place], [place], [place]   [facility] [address]  [place], [place]   [facility] - Intensive Outpatient [address], [place], [place]   [facility] [place]  [place], [place] [phone] or [phone]   [facility] ([facility]) [address] [place], [place] Accepts Medicaid  [facility] [place] [address],  [place] [phone] Substance Abuse/Addiction Treatment   [facility] Organization         Address  Phone  Notes  [facility]  [phone]   Domenic Schwab, PhD [address] Arlis Porta [place], [place]   [phone] or [phone]   [place], [place] [phone]   [facility] [address], [place], [place] [phone]  Insurance/Medicaid/sponsorship through [facility] [address] [place] [address]                                    [place], [place] [phone] [place] [address], [place] [phone]    Dr. Adele Schilder  [phone]   [facility] Dept. 1) [address] [place] 2) [place] 3)  [address] [address], [place] [phone]  [phone]   [place] [phone] or [phone] (After Hours)

## 2014-01-27 NOTE — ED Provider Notes (Signed)
CSN: [id number]     Arrival date & time 01/26/14  2221 History   First MD Initiated Contact with Patient 01/26/14 2350     Chief Complaint  Patient presents with  . Chest Pain     (Consider location/radiation/quality/duration/timing/severity/associated sxs/prior Treatment) HPI 28 yo female presents with Chest pain x 1 month that just got worse 1 week ago. Pain described as constant and sharp. Pain rated at a 9/10 and does not radiate. Denies any associated shortness of breath. Pain improved with ibuprofen. Pain worse with movement and "sometimes when I eat". Patient admits to long hx of acid reflux and heart burn. Patient denies fever/chills, HA, N/V, Abdominal pain. 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 Denies any PMH.   Advanced age > 29 yo: No HTN: No Hyperlipidemia: No Cigarette smoking: No Diabetes Mellitus: No Family hx of CAD or MI < 29 yo : No Female or Post menopausal: No Cocaine use: No Prior MI: No CABG: No Stress test: No  HR > 100 bpm: No O2 sat on RA < 95%: No Prior hx of venous thromboembolism:No Trauma or surgery in past 4 wks:No Hemoptysis:No Exogenous Estrogen use:No Unilateral Leg swelling: No Pre tests probability for PE < 15%:No  Past Medical History  Diagnosis Date  . No pertinent past medical history   . Depression     not currently on meds   Past Surgical History  Procedure Laterality Date  . No past surgeries     No family history on file. History  Substance Use Topics  . Smoking status: Never Smoker   . Smokeless tobacco: Not on file  . Alcohol Use: No   OB History   Grav Para Term Preterm Abortions TAB SAB Ect Mult Living   2 2 2  0 0 0 0 0 0 2     Review of Systems  Constitutional: Negative for fever and chills.  Genitourinary:       Patient states she is currently on her menstrual period.    All other systems reviewed and are negative.    Allergies  Review of patient's allergies  indicates no known allergies.  Home Medications   Current Outpatient Rx  Name  Route  Sig  Dispense  Refill  . ibuprofen (ADVIL,MOTRIN) 200 MG tablet   Oral   Take 200 mg by mouth every 6 (six) hours as needed.         . naproxen (NAPROSYN) 500 MG tablet   Oral   Take 1 tablet (500 mg total) by mouth 2 (two) times daily.   30 tablet   0    BP 100/66  Pulse 64  Temp(Src) 98.1 F (36.7 C) (Oral)  Resp 19  Ht 5\' 6"  (1.676 m)  Wt 168 lb (76.204 kg)  BMI 27.13 kg/m2  SpO2 100% Physical Exam  Nursing note and vitals reviewed. Constitutional: She is oriented to person, place, and time. She appears well-developed and well-nourished. No distress.  HENT:  Head: Normocephalic and atraumatic.  Eyes: Conjunctivae and EOM are normal.  Neck: Normal range of motion. Neck supple. No JVD present.  Cardiovascular: Normal rate and regular rhythm.  Exam reveals no gallop and no friction rub.   No murmur heard. Pulmonary/Chest: Effort normal and breath sounds normal. No respiratory distress. She has no wheezes. She has no rales. She exhibits tenderness (Chest pain illicited with compression of sternum. ).  Abdominal: Soft. Bowel sounds are normal. There  is no tenderness.  Musculoskeletal: Normal range of motion. She exhibits no edema.  Neurological: She is alert and oriented to person, place, and time.  Skin: Skin is warm and dry. She is not diaphoretic.  Psychiatric: She has a normal mood and affect. Her behavior is normal.    ED Course  Procedures (including critical care time) Labs Review Labs Reviewed  CBC - Abnormal; Notable for the following:    HCT 35.8 (*)    All other components within normal limits  BASIC METABOLIC PANEL  POCT I-STAT TROPONIN I   Imaging Review Dg Chest 2 View  01/26/2014   CLINICAL DATA:  Chest pain.  EXAM: CHEST  2 VIEW  COMPARISON:  DG CHEST 2 VIEW dated 01/25/2011; DG CHEST 1V PORT dated 05/01/2009; DG CHEST 1 VIEW dated 01/25/2009  FINDINGS:  Cardiopericardial silhouette within normal limits. Mediastinal contours normal. Trachea midline. No airspace disease or effusion.  IMPRESSION: No active cardiopulmonary disease.   Electronically Signed   By: Andreas Newport M.D.   On: 01/26/2014 23:44    EKG Interpretation    Date/Time:  Monday January 26 2014 23:04:28 EST Ventricular Rate:  59 PR Interval:  142 QRS Duration: 86 QT Interval:  409 QTC Calculation: 405 R Axis:   51 Text Interpretation:  Sinus rhythm Low voltage, precordial leads Nonspecific ST abnormality No significant change since last tracing Confirmed by OPITZ  MD, BRIAN ([id number]) on 01/27/2014 12:57:54 AM            MDM   Final diagnoses:  Chest wall pain   Vitals WNL.  Patient in NAD. PERC negative Minimal cardiac risk.  Pain reproducible on Exam.  CXR negative, Troponin negative  Patient's pain appear musculoskeletal in nature.  Plan to treat patient with NSAIDs and have her follow up with her PCP. Discussed findings with patient. Resource guide provided. Patient agrees with plan. Discharged in good condition.  Meds given in ED:  Medications  gi cocktail (Maalox,Lidocaine,Donnatal) (30 mLs Oral Given 01/27/14 0047)  ketorolac (TORADOL) injection 60 mg (60 mg Intramuscular Given 01/27/14 0144)    New Prescriptions   NAPROXEN (NAPROSYN) 500 MG TABLET    Take 1 tablet (500 mg total) by mouth 2 (two) times daily.        Rudene Anda, PA-C 01/27/14 0157

## 2014-01-27 NOTE — ED Provider Notes (Signed)
Medical screening examination/treatment/procedure(s) were performed by non-physician practitioner and as supervising physician I was immediately available for consultation/collaboration.    Ainslee Sou, MD 01/27/14 0740 

## 2014-03-31 ENCOUNTER — Emergency Department (HOSPITAL_COMMUNITY)
Admission: EM | Admit: 2014-03-31 | Discharge: 2014-03-31 | Disposition: A | Discharge status: Home / Self Care | Payer: Self-pay | Attending: Emergency Medicine | Admitting: Emergency Medicine

## 2014-03-31 ENCOUNTER — Encounter (HOSPITAL_COMMUNITY): Payer: Self-pay | Admitting: Emergency Medicine

## 2014-03-31 DIAGNOSIS — Z8659 Personal history of other mental and behavioral disorders: Secondary | ICD-10-CM | POA: Insufficient documentation

## 2014-03-31 DIAGNOSIS — Z791 Long term (current) use of non-steroidal anti-inflammatories (NSAID): Secondary | ICD-10-CM | POA: Insufficient documentation

## 2014-03-31 DIAGNOSIS — L299 Pruritus, unspecified: Secondary | ICD-10-CM | POA: Insufficient documentation

## 2014-03-31 MED ORDER — DIPHENHYDRAMINE HCL 25 MG PO TABS: 25.0000 mg | ORAL_TABLET | Freq: Four times a day (QID) | ORAL | Status: AC | PRN

## 2014-03-31 NOTE — ED Provider Notes (Signed)
CSN: [id number]     Arrival date & time 03/31/14  0217 History   First MD Initiated Contact with Patient 03/31/14 0255     Chief Complaint  Patient presents with  . Pruritis     (Consider location/radiation/quality/duration/timing/severity/associated sxs/prior Treatment) HPI  This is a 28 year old female who presents from home with itching. Patient states that she had a sensation of warmth and itching all over her body at approximately 11 PM. She just eaten some rice. She states that rice is a normal food for her. She denies any new exposures, foods, detergents.  She denies any shortness of breath, nausea, vomiting, chest pain, or rash. Patient's symptoms have resolved and she is not having any symptoms at this time. She did not take anything at home prior to arrival.  Past Medical History  Diagnosis Date  . No pertinent past medical history   . Depression     not currently on meds   Past Surgical History  Procedure Laterality Date  . No past surgeries     No family history on file. History  Substance Use Topics  . Smoking status: Never Smoker   . Smokeless tobacco: Not on file  . Alcohol Use: No   OB History   Grav Para Term Preterm Abortions TAB SAB Ect Mult Living   2 2 2  0 0 0 0 0 0 2     Review of Systems  Respiratory: Negative for chest tightness and shortness of breath.   Cardiovascular: Negative for chest pain.  Gastrointestinal: Negative for abdominal pain.  Skin: Negative for rash.       Itching  All other systems reviewed and are negative.     Allergies  Review of patient's allergies indicates no known allergies.  Home Medications   Current Outpatient Rx  Name  Route  Sig  Dispense  Refill  . naproxen (NAPROSYN) 500 MG tablet   Oral   Take 1 tablet (500 mg total) by mouth 2 (two) times daily.   30 tablet   0   . diphenhydrAMINE (BENADRYL) 25 MG tablet   Oral   Take 1 tablet (25 mg total) by mouth every 6 (six) hours as needed for itching.   20  tablet   0    There were no vitals taken for this visit. Physical Exam  Nursing note and vitals reviewed. Constitutional: She is oriented to person, place, and time. She appears well-developed and well-nourished. No distress.  HENT:  Head: Normocephalic and atraumatic.  Mouth/Throat: Oropharynx is clear and moist.  No swelling of the oropharynx noted  Eyes: Pupils are equal, round, and reactive to light.  Neck: Neck supple.  Cardiovascular: Normal rate, regular rhythm and normal heart sounds.   No murmur heard. Pulmonary/Chest: Effort normal and breath sounds normal. No respiratory distress. She has no wheezes.  Neurological: She is alert and oriented to person, place, and time.  Skin: Skin is warm and dry. No rash noted.  Psychiatric: She has a normal mood and affect.    ED Course  Procedures (including critical care time) Labs Review Labs Reviewed - No data to display Imaging Review No results found.   EKG Interpretation None      MDM   Final diagnoses:  Pruritus   Patient presents with cold-like the itching. Not associated with shortness of breath, chest pain, or rash. Symptoms have resolved and were self-limited. She denies any new ingestions. She denies eating fish. Physical exam is completely benign. She is in  no acute distress and there are no signs of allergic reaction or anaphylaxis. Discuss with patient that I am unsure of what caused her symptoms. She was instructed to use Benadryl when necessary at home if symptoms return. She was given strict return precautions.  After history, exam, and medical workup I feel the patient has been appropriately medically screened and is safe for discharge home. Pertinent diagnoses were discussed with the patient. Patient was given return precautions.    Shon Batonourtney F Horton, MD 03/31/14 [phone]

## 2014-03-31 NOTE — Discharge Instructions (Signed)
Pruritus   Pruritis is an itch. There are many different problems that can cause an itch. Dry skin is one of the most common causes of itching. Most cases of itching do not require medical attention.   HOME CARE INSTRUCTIONS   Make sure your skin is moistened on a regular basis. A moisturizer that contains petroleum jelly is best for keeping moisture in your skin. If you develop a rash, you may try the following for relief:    Use corticosteroid cream.   Apply cool compresses to the affected areas.   Bathe with Epsom salts or baking soda in the bathwater.   Soak in colloidal oatmeal baths. These are available at your pharmacy.   Apply baking soda paste to the rash. Stir water into baking soda until it reaches a paste-like consistency.   Use an anti-itch lotion.   Take over-the-counter diphenhydramine medicine by mouth as the instructions direct.   Avoid scratching. Scratching may cause the rash to become infected. If itching is very bad, your caregiver may suggest prescription lotions or creams to lessen your symptoms.   Avoid hot showers, which can make itching worse. A cold shower may help with itching as long as you use a moisturizer after the shower.  SEEK MEDICAL CARE IF:  The itching does not go away after several days.  Document Released: 08/16/2011 Document Revised: 02/26/2012 Document Reviewed: 08/16/2011  ExitCare Patient Information 2014 [employer].

## 2014-03-31 NOTE — ED Notes (Signed)
Patient is from home. Report itching all over since around 2200 this evening. Patent doesn't report use of medication to treat this problem. Patient states she ate rice about 2200, which this is not the first time she ate rice.

## 2014-03-31 NOTE — ED Notes (Signed)
Patient states that she felt hot all over prior to the itching episode. Patient denies itching or hot feeling at this time.

## 2014-10-19 ENCOUNTER — Encounter (HOSPITAL_COMMUNITY): Payer: Self-pay | Admitting: Emergency Medicine
